# Patient Record
Sex: Male | Born: 1990 | Race: White | Hispanic: No | Marital: Single | Smoking: Heavy tobacco smoker
Health system: Southern US, Community
[De-identification: ages and names within clinical notes are randomized; demographics above are authoritative.]

---

## 2008-04-12 ENCOUNTER — Emergency Department (HOSPITAL_COMMUNITY): Admission: EM | Admit: 2008-04-12 | Discharge: 2008-04-13 | Payer: Self-pay | Admitting: Emergency Medicine

## 2008-08-18 ENCOUNTER — Emergency Department (HOSPITAL_COMMUNITY): Admission: EM | Admit: 2008-08-18 | Discharge: 2008-08-18 | Payer: Self-pay | Admitting: Emergency Medicine

## 2008-09-16 ENCOUNTER — Emergency Department (HOSPITAL_COMMUNITY): Admission: EM | Admit: 2008-09-16 | Discharge: 2008-09-16 | Payer: Self-pay | Admitting: Emergency Medicine

## 2008-10-23 ENCOUNTER — Emergency Department (HOSPITAL_COMMUNITY): Admission: EM | Admit: 2008-10-23 | Discharge: 2008-10-24 | Payer: Self-pay | Admitting: Emergency Medicine

## 2008-11-06 ENCOUNTER — Emergency Department (HOSPITAL_COMMUNITY): Admission: EM | Admit: 2008-11-06 | Discharge: 2008-11-06 | Payer: Self-pay | Admitting: Emergency Medicine

## 2008-11-25 ENCOUNTER — Emergency Department (HOSPITAL_COMMUNITY): Admission: EM | Admit: 2008-11-25 | Discharge: 2008-11-25 | Payer: Self-pay | Admitting: Emergency Medicine

## 2008-12-07 ENCOUNTER — Emergency Department (HOSPITAL_COMMUNITY): Admission: EM | Admit: 2008-12-07 | Discharge: 2008-12-07 | Payer: Self-pay | Admitting: Emergency Medicine

## 2009-01-05 IMAGING — CR DG THORACIC SPINE 2V
3 series · 3 of 3 positions shown · non-contrast
Comparison: None

CLINICAL DATA: Trauma

THORACIC SPINE - 2 VIEW

[t t-spine a.p.]
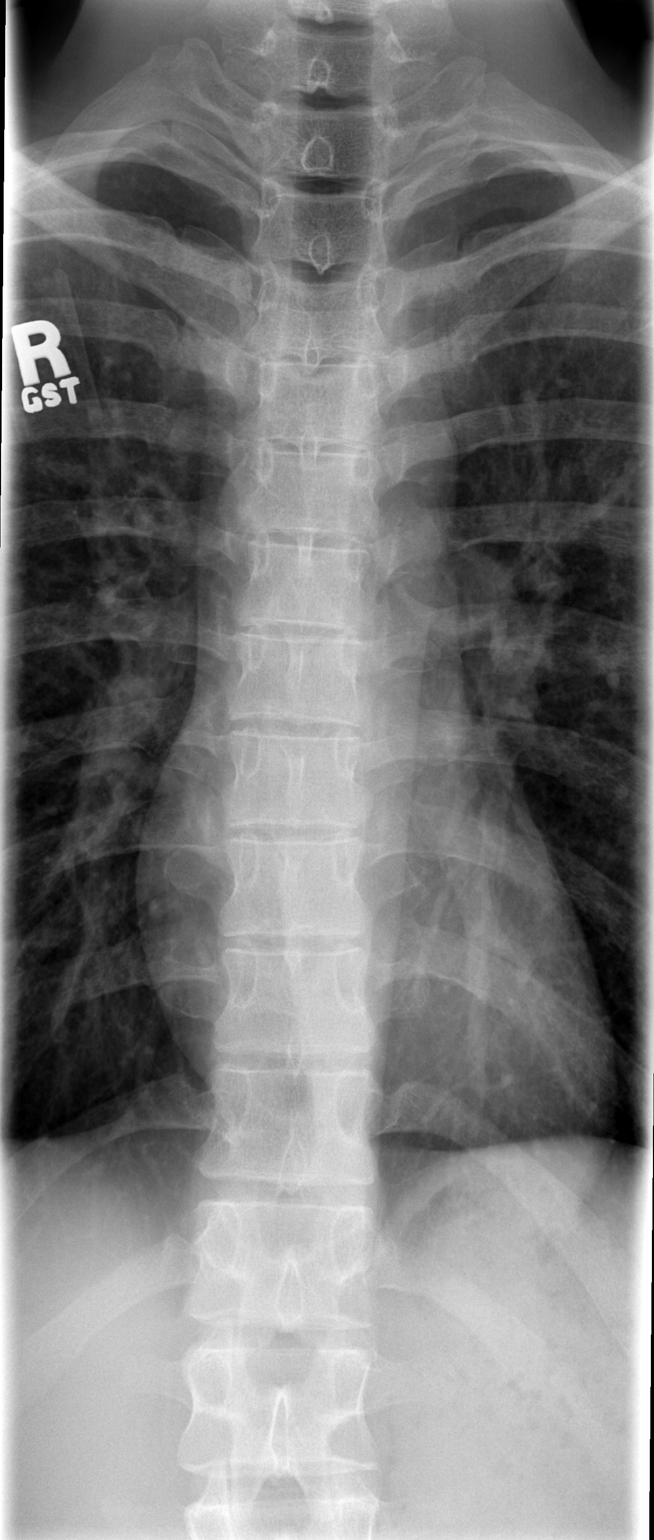

[t t-spine lat]
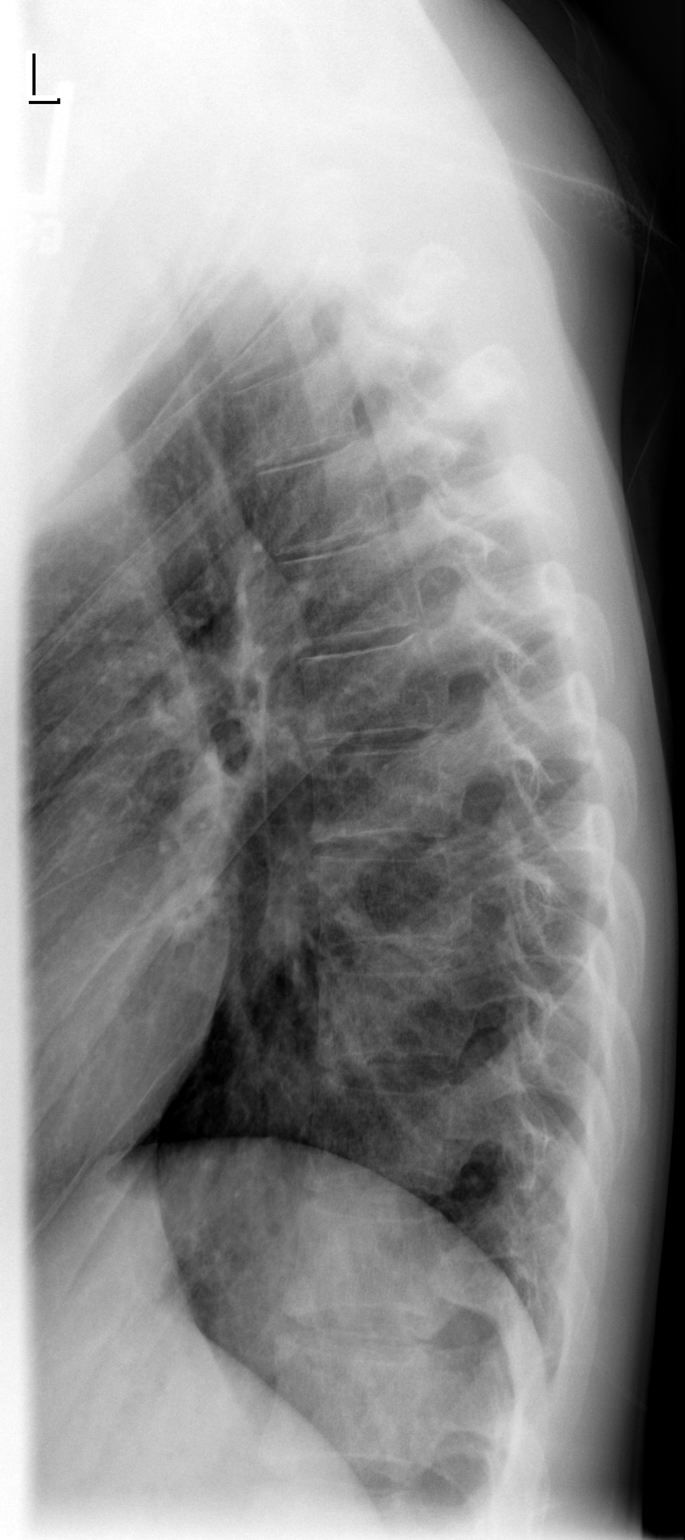

[t swimmers]
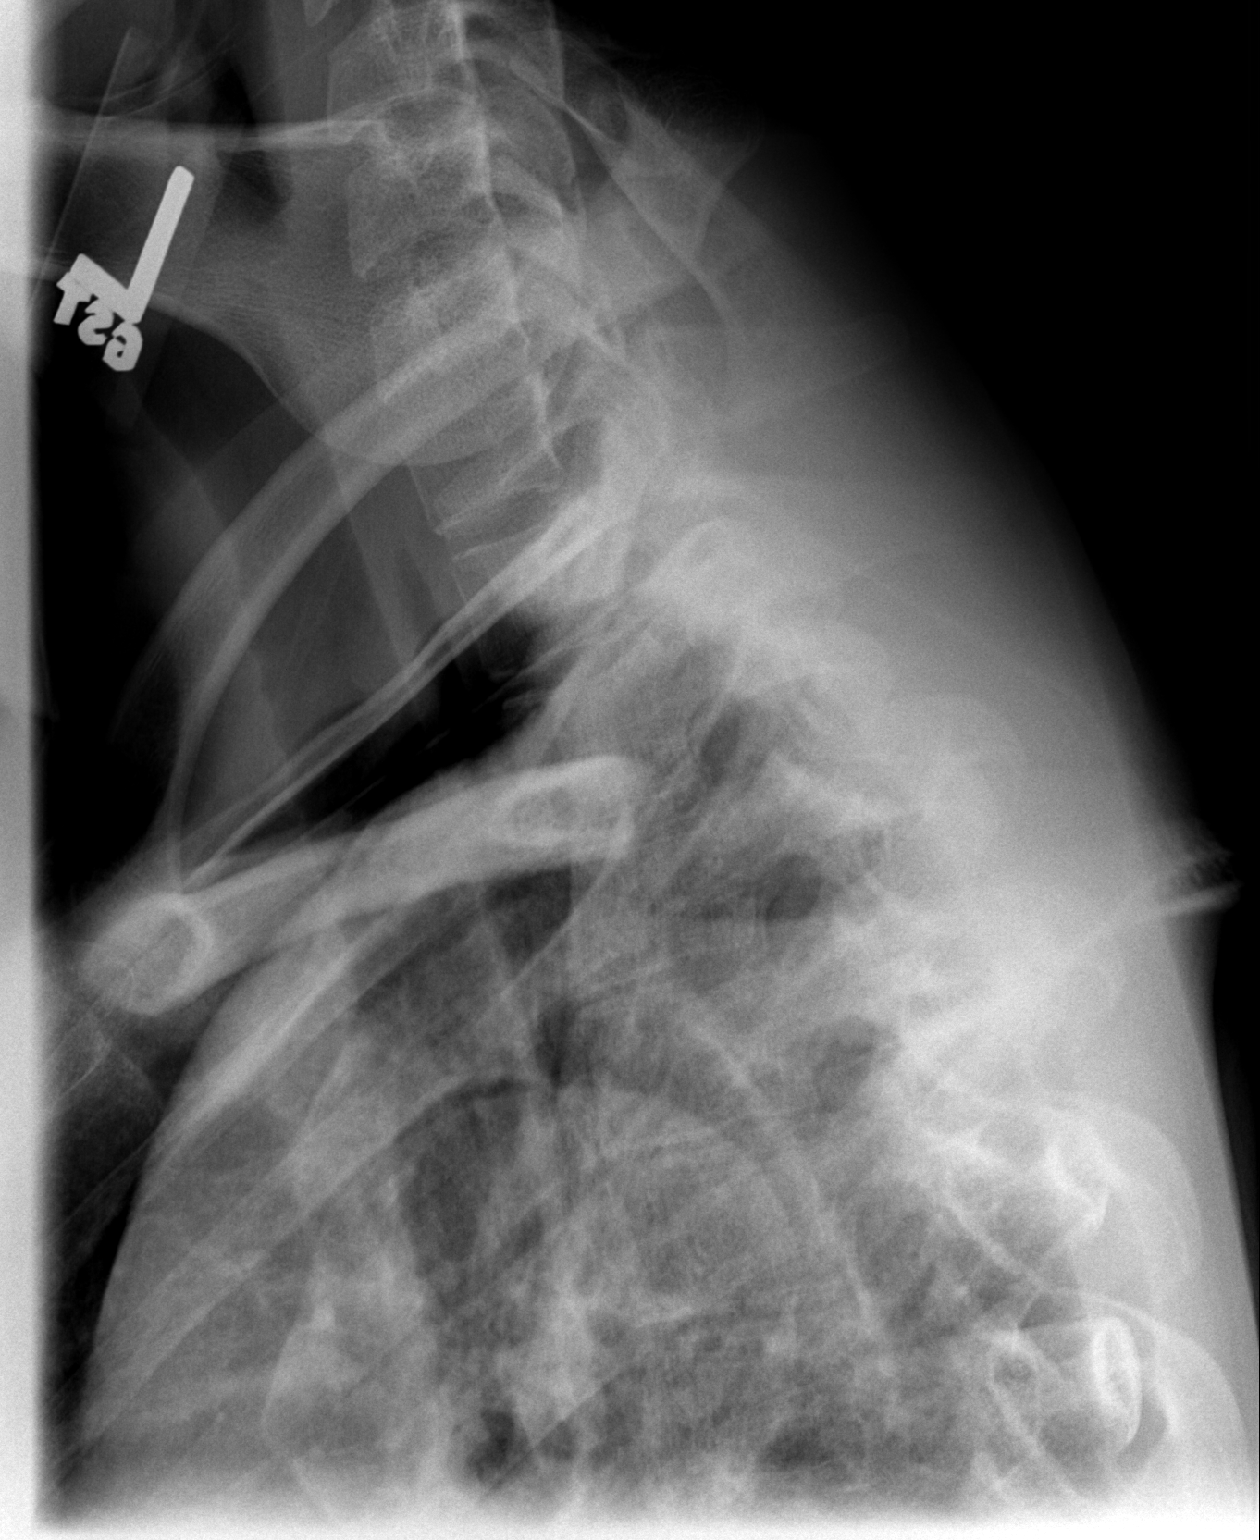

[3 of 3 positions shown; findings below may reference images not displayed]

FINDINGS: Anatomic alignment.  Negative for fracture.
IMPRESSION: Negative

## 2014-09-11 ENCOUNTER — Encounter (HOSPITAL_BASED_OUTPATIENT_CLINIC_OR_DEPARTMENT_OTHER): Payer: Self-pay | Admitting: *Deleted

## 2014-09-11 DIAGNOSIS — K088 Other specified disorders of teeth and supporting structures: Secondary | ICD-10-CM | POA: Insufficient documentation

## 2014-09-11 DIAGNOSIS — K029 Dental caries, unspecified: Secondary | ICD-10-CM | POA: Insufficient documentation

## 2014-09-11 DIAGNOSIS — Z720 Tobacco use: Secondary | ICD-10-CM | POA: Insufficient documentation

## 2014-09-11 NOTE — ED Notes (Signed)
Pt. Reports bottom L tooth pain very back tooth

## 2014-09-12 ENCOUNTER — Emergency Department (HOSPITAL_BASED_OUTPATIENT_CLINIC_OR_DEPARTMENT_OTHER)
Admission: EM | Admit: 2014-09-12 | Discharge: 2014-09-12 | Disposition: A | Discharge status: Home / Self Care | Payer: Self-pay | Attending: Emergency Medicine | Admitting: Emergency Medicine

## 2014-09-12 DIAGNOSIS — K0889 Other specified disorders of teeth and supporting structures: Secondary | ICD-10-CM

## 2014-09-12 MED ORDER — BUPIVACAINE-EPINEPHRINE (PF) 0.5% -1:200000 IJ SOLN
INTRAMUSCULAR | Status: AC
  Administered 2014-09-12: 1.8 mL
  Filled 2014-09-12: qty 1.8

## 2014-09-12 MED ORDER — OXYCODONE-ACETAMINOPHEN 5-325 MG PO TABS
1.0000 | ORAL_TABLET | Freq: Once | ORAL | Status: AC
  Administered 2014-09-12: 1 via ORAL
  Filled 2014-09-12: qty 1

## 2014-09-12 NOTE — Discharge Instructions (Signed)
Dental Pain °A tooth ache may be caused by cavities (tooth decay). Cavities expose the nerve of the tooth to air and hot or cold temperatures. It may come from an infection or abscess (also called a boil or furuncle) around your tooth. It is also often caused by dental caries (tooth decay). This causes the pain you are having. °DIAGNOSIS  °Your caregiver can diagnose this problem by exam. °TREATMENT  °· If caused by an infection, it may be treated with medications which kill germs (antibiotics) and pain medications as prescribed by your caregiver. Take medications as directed. °· Only take over-the-counter or prescription medicines for pain, discomfort, or fever as directed by your caregiver. °· Whether the tooth ache today is caused by infection or dental disease, you should see your dentist as soon as possible for further care. °SEEK MEDICAL CARE IF: °The exam and treatment you received today has been provided on an emergency basis only. This is not a substitute for complete medical or dental care. If your problem worsens or new problems (symptoms) appear, and you are unable to meet with your dentist, call or return to this location. °SEEK IMMEDIATE MEDICAL CARE IF:  °· You have a fever. °· You develop redness and swelling of your face, jaw, or neck. °· You are unable to open your mouth. °· You have severe pain uncontrolled by pain medicine. °MAKE SURE YOU:  °· Understand these instructions. °· Will watch your condition. °· Will get help right away if you are not doing well or get worse. °Document Released: 07/28/2005 Document Revised: 10/20/2011 Document Reviewed: 03/15/2008 °ExitCare® Patient Information ©2015 [employer]. This information is not intended to replace advice given to you by your health care provider. Make sure you discuss any questions you have with your health care provider. ° °Emergency Department Resource Guide °1) Find a Doctor and Pay Out of Pocket °Although you won't have to find out who  is covered by your insurance plan, it is a good idea to ask around and get recommendations. You will then need to call the office and see if the doctor you have chosen will accept you as a new patient and what types of options they offer for patients who are self-pay. Some doctors offer discounts or will set up payment plans for their patients who do not have insurance, but you will need to ask so you aren't surprised when you get to your appointment. ° °2) Contact Your Local Health Department °Not all health departments have doctors that can see patients for sick visits, but many do, so it is worth a call to see if yours does. If you don't know where your local health department is, you can check in your phone book. The CDC also has a tool to help you locate your state's health department, and many state websites also have listings of all of their local health departments. ° °3) Find a Walk-in Clinic °If your illness is not likely to be very severe or complicated, you may want to try a walk in clinic. These are popping up all over the country in pharmacies, drugstores, and shopping centers. They're usually staffed by nurse practitioners or physician assistants that have been trained to treat common illnesses and complaints. They're usually fairly quick and inexpensive. However, if you have serious medical issues or chronic medical problems, these are probably not your best option. ° °No Primary Care Doctor: °- Call [facility] at  832-8000 - they can help you locate a primary   care doctor that  accepts your insurance, provides certain services, etc. °- Physician Referral Service- 1-800-533-3463 ° °Chronic Pain Problems: °Organization         Address  Phone   Notes  °[facility]  (336) 297-2271 Patients need to be referred by their primary care doctor.  ° °Medication Assistance: °Organization         Address  Phone   Notes  °[place] [facility] [address]  [address] °[place], [place] [address] (336) 641-8030 --Must be a resident of [place] °-- Must have NO insurance coverage whatsoever (no Medicaid/ Medicare, etc.) °-- The pt. MUST have a primary care doctor that directs their care regularly and follows them in the community °  °[facility]  (866) 331-1348   °[employer]  (888) 892-1162   ° °Agencies that provide inexpensive medical care: °Organization         Address  Phone   Notes  °[facility]  (336) 832-8035   °[facility]    (336) 832-7272   °[facility] [address] °[place], [place] [address] (336) 832-4777   °[facility] [address], °[place] (336) 271-4999   °[facility]    (336) 373-0678   °[facility]    (336) 272-1050   °[facility] ° [address], [place] Phone:  (336) 832-4444, Fax:  (336) 832-4440 Hours of Operation:  9 am - 6 pm, M-F.  Also accepts Medicaid/Medicare and self-pay.  °[facility] ° [address], [place] Phone: (336) 832-3150, Fax: (336) 832-3151. Hours of Operation:  8:30 am - 5:30 pm, M-F.  Also accepts Medicaid and self-pay.  °[facility] [place] [address], [place] Phone: (336) 878-6027   °[facility] [address], [place], [place] (336)723-1848, Ext. 123 Mondays & Thursdays: 7-9 AM.  First 15 patients are seen on a first come, first serve basis. °  ° °Medicaid-accepting [place] Providers: ° °Organization         Address  Phone   Notes  °[facility] [address], [place] (336) 641-2100 Also accepts self-pay patients.  °[facility] [address], [place] ° (336) 856-9996   °[facility] [address], [place] (336) 288-8857   °[facility] [address] [place] [address], [place] (336) 299-7000   °Veita Bland [address], [place]  ° (336) 373-1557 Only accepts [place] Medicaid patients after they have their name applied to their card.  ° °Self-Pay (no insurance) in [place]: ° °Organization         Address  Phone   Notes  °Sickle Cell Patients, [facility] [address], [place] (336) 832-1970   °[facility] [address], [place] (336) 832-4400   °[facility] [place] ° [address], [place] (336) 992-4800   °[facility]/Dr. Osei-Bonsu ° [address] [place] [address], [place] or [address], [place] (336) 841-8500 Phone number for both [place] and [place] locations is the same.  °[facility] [address], [place] (336) 299-0000   °[facility] [address] [place] [address], [place] or [address] (336) 852-7530 °(336) 878-2260   °  [facility] [address], [place] (336) 350-1642, phone; (336) 294-5005, fax Sees patients 1st and 3rd Saturday of every month.  Must not qualify for public or private insurance (i.e. Medicaid, Medicare, [place] Health Choice, Veterans' Benefits) • Household income should be no more than 200% of the poverty level •The clinic cannot treat you if you are pregnant or think you are pregnant • Sexually transmitted diseases are not treated at the clinic.  ° ° °Dental Care: °Organization         Address  Phone  Notes  °[facility] [facility] [address], [place] (336) 641-6152 Accepts children up to age 21 who are enrolled in Medicaid or [place] Health Choice; pregnant women with a Medicaid card; and children who have applied for Medicaid or [place] Health Choice, but were declined, whose parents can pay a reduced fee at time of service.  °[facility] [place]  [address], [place] (336) 641-7733 Accepts children up to age 21 who are  enrolled in Medicaid or [place] Health Choice; pregnant women with a Medicaid card; and children who have applied for Medicaid or [place] Health Choice, but were declined, whose parents can pay a reduced fee at time of service.  °[facility] ° [address], [place] (336) 641-4533 Patients are seen by appointment only. Walk-ins are not accepted. [facility] will see patients 18 years of age and older. °Monday - Tuesday (8am-5pm) °Most Wednesdays (8:30-5pm) °$30 per visit, cash only  °[facility] ° [address], [place] (336) 641-4533 Patients are seen by appointment only. Walk-ins are not accepted. [facility] will see patients 18 years of age and older. °One Wednesday Evening (Monthly: Volunteer Based).  $30 per visit, cash only  °[facility]  (919) 537-3737 for adults; Children under age 4, call Graduate Pediatric Dentistry at (919) [phone]. Children aged 4-14, please call (919) [phone] to request a pediatric application. ° Dental services are provided in all areas of dental care including fillings, crowns and bridges, complete and partial dentures, implants, gum treatment, root canals, and extractions. Preventive care is also provided. Treatment is provided to both adults and children. °Patients are selected via a lottery and there is often a waiting list. °  °[facility] [address], °[place] ° (336) 763-8833 [email or web address] °  °[facility] [address], [place], [place] (336)723-1848, Ext. 123 Second and Fourth Thursday of each month, opens at 6:30 AM; Clinic ends at 9 AM.  Patients are seen on a first-come first-served basis, and a limited number are seen during each clinic.  ° °[facility] ° [address], [place], [place] (336) 723-7904   Eligibility Requirements °You must have lived in [place], [place], or [place] counties for at least the last three months. °  You  cannot be eligible for state or federal sponsored healthcare insurance, including Veterans Administration, Medicaid, or Medicare. °  You generally cannot be eligible for healthcare insurance through your employer.  °  How to apply: °Eligibility screenings are held every Tuesday and Wednesday afternoon from 1:00 pm until 4:00 pm. You do not need an appointment for the interview!  °[facility] [address], [place], [place] 336-631-2330   °[facility] [facility]  336-342-8273   °[facility]  336-703-3100   °[facility]  336-570-6415   ° °Behavioral Health Resources in the Community: °Intensive Outpatient Programs °Organization         Address  Phone  Notes  °[facility] [address], [place], [place] 336-878-6098   °[facility] [address], [place], [place] 336-832-9800   °[facility] [address], [place], [place] ° 336-882-2125   °[facility] [address],  °[place], [place] 1-800-853-5163 or 336-641-4981   °Substance Abuse Resources °Organization         Address  Phone  Notes  °[facility]  336-882-2125   °[facility]  336-784-9470   °[facility]  336-285-9073   °[facility]  336-845-3988   °Residential & Outpatient Substance Abuse Program  1-800-659-3381   °Psychological Services °Organization         Address  Phone  Notes  °[facility]  336- 832-9600   °[facility]  336- 378-7881   °[facility] [address], [place] 1-800-853-5163 or 336-641-4981   ° °Mobile Crisis Teams °Organization         Address  Phone  Notes  °[facility], Mobile Crisis Care Unit  1-877-626-1772   °Assertive °[facility] ° [address] [place], [place] 336-834-9664   °Sharon DeEsch [address] °[place] 336-554-5454   ° °Self-Help/Support  Groups °Organization         Address  Phone             Notes  °[facility] [place] - variety of support groups  336- 373-1402 Call for more information  °Narcotics Anonymous (NA), [facility] [address], °[place]  2 meetings at this location  ° °Residential Treatment Programs °Organization         Address  Phone  Notes  °[facility] [address],    °[place]  1-866-801-8205   °[facility] ° [address], [place], [place] 704-293-8524   °[facility] [address], [place] 336-845-3988 Admissions: 8am-3pm M-F  °[facility] [address]    °[place], [place] 336-841-1104   °[facility] [address], [place], [place] 336-379-7146   °[facility] [address]  °[place], [place] 336-285-9073   °[facility] - Intensive Outpatient [address], [place], [place] 336-852-3033   °[facility] [address]  °[place], [place] 1-877-615-2722 or 336-784-9470   °[facility] [address] [place], [place] 336-227-7417 Accepts Medicaid  °[facility] [address]  °[place] 1-800-659-3381 Substance Abuse/Addiction Treatment  ° °[place] Behavioral Health Resources °Organization         Address  Phone  Notes  °[facility]  (888) 581-9988   °Julie Brannon, PhD [address] [place], [place]   (336) 349-5553 or (336) 951-0000   °[facility]   [address] °, [place] (336) 349-4454   °[facility] [address], [place], [place] (336) 342-8316 Insurance/Medicaid/sponsorship through [facility]  °[facility] [address]                                    , [place] (336) 342-8316 Therapy/tele-psych/case  °[facility]   [address]  ° [place], [place] (336) 349-2233    °Dr. Arfeen  (336) 349-4544   °[facility] [place]  [facility]  [employer] [place] [facility] 1) [address], [place] °2) [address], [place] °3)  [address], [place] (336) 349-3220 °(336) 342-7768 ° °(336) 342-8140   °[place] [facility] (336) 342-1394 or (336) 342-3537 (After Hours)    ° ° ° °

## 2014-09-12 NOTE — ED Provider Notes (Signed)
CSN: [id number]     Arrival date & time 09/11/14  2333 History  This chart was scribed for Mirian MoMatthew Mckynleigh Mussell, MD by Gwenyth Oberatherine Macek, ED Scribe. This patient was seen in room MH03/MH03 and the patient's care was started at 12:33 AM.    Chief Complaint  Patient presents with  . Dental Pain   The history is provided by the patient. No language interpreter was used.    HPI Comments: Alan Montoya is a 24 y.o. male with a history of asthma who presents to the Emergency Department complaining of constant, moderate left lower dental pain that started 4 months ago but became worse today. He states that he has a fractured molar associated with the pain. Pt reports that the pain becomes worse with eating. He does not have a dentist. Pt denies fever, nausea and vomiting as associated symptoms.    History reviewed. No pertinent past medical history. History reviewed. No pertinent past surgical history. No family history on file. History  Substance Use Topics  . Smoking status: Heavy Tobacco Smoker    Types: Cigarettes  . Smokeless tobacco: Not on file  . Alcohol Use: No    Review of Systems  Constitutional: Negative for fever.  HENT: Positive for dental problem.   Gastrointestinal: Negative for nausea and vomiting.  All other systems reviewed and are negative.   Allergies  Aspirin and Ultram  Home Medications   Prior to Admission medications   Not on File   BP 119/74 mmHg  Pulse 56  Temp(Src) 97.8 F (36.6 C) (Oral)  Resp 18  Ht 5\' 11"  (1.803 m)  Wt 140 lb (63.504 kg)  BMI 19.53 kg/m2  SpO2 100% Physical Exam  Constitutional: He is oriented to person, place, and time. He appears well-developed and well-nourished.  HENT:  Head: Normocephalic and atraumatic.  Mouth/Throat: No trismus in the jaw. Abnormal dentition (fracture of LL2nd molar). Dental caries present. No dental abscesses.  Eyes: Conjunctivae and EOM are normal.  Neck: Normal range of motion. Neck supple.   Cardiovascular: Normal rate, regular rhythm and normal heart sounds.   Pulmonary/Chest: Effort normal and breath sounds normal. No respiratory distress.  Abdominal: He exhibits no distension. There is no tenderness. There is no rebound and no guarding.  Musculoskeletal: Normal range of motion.  Neurological: He is alert and oriented to person, place, and time.  Skin: Skin is warm and dry.  Vitals reviewed.   ED Course  NERVE BLOCK Date/Time: 09/12/2014 5:31 AM Performed by: Mirian MoGENTRY, Edita Weyenberg Authorized by: Mirian MoGENTRY, Tabytha Gradillas Consent: Verbal consent obtained. Body area: face/mouth Nerve: inferior alveolar Laterality: left Needle gauge: 25 G Location technique: anatomical landmarks Local anesthetic: bupivacaine 0.5% without epinephrine Patient tolerance: Patient tolerated the procedure well with no immediate complications   (including critical care time) DIAGNOSTIC STUDIES: Oxygen Saturation is 100% on RA, normal by my interpretation.    COORDINATION OF CARE: 12:36 AM Discussed treatment plan with pt at bedside. He agreed to plan.  Labs Review Labs Reviewed - No data to display  Imaging Review No results found.   EKG Interpretation None      MDM   Final diagnoses:  Pain, dental    24 y.o. male with pertinent PMH of dental fracture presents with dental pain as described above. Unconjugated history, exam without abscess or other comforting factors. Patient given by mouth pain medication and nerve block. Discharged home with dentistry follow-up and standard return precautions. Discussed that he would not receive narcotic pain medicine prescription due to  chronic issue.    I have reviewed all laboratory and imaging studies if ordered as above  1. Pain, dental          Mirian Mo, MD 09/12/14 [phone]

## 2014-09-12 NOTE — ED Notes (Signed)
C/o left lower tooth pain onset this pm

## 2023-03-12 DEATH — deceased
